# Patient Record
Sex: Male | Born: 1970 | Race: White | Hispanic: No | Marital: Married | Smoking: Never smoker
Health system: Southern US, Community
[De-identification: ages and names within clinical notes are randomized; demographics above are authoritative.]

## PROBLEM LIST (undated history)

## (undated) DIAGNOSIS — J302 Other seasonal allergic rhinitis: Secondary | ICD-10-CM

## (undated) DIAGNOSIS — E7800 Pure hypercholesterolemia, unspecified: Secondary | ICD-10-CM

## (undated) DIAGNOSIS — K219 Gastro-esophageal reflux disease without esophagitis: Secondary | ICD-10-CM

## (undated) DIAGNOSIS — K4090 Unilateral inguinal hernia, without obstruction or gangrene, not specified as recurrent: Secondary | ICD-10-CM

## (undated) DIAGNOSIS — R519 Headache, unspecified: Secondary | ICD-10-CM

## (undated) DIAGNOSIS — J45909 Unspecified asthma, uncomplicated: Secondary | ICD-10-CM

## (undated) HISTORY — PX: WISDOM TOOTH EXTRACTION: SHX21

---

## 1998-10-15 ENCOUNTER — Ambulatory Visit (HOSPITAL_COMMUNITY): Admission: RE | Admit: 1998-10-15 | Discharge: 1998-10-15 | Payer: Self-pay | Admitting: Obstetrics & Gynecology

## 1999-04-08 ENCOUNTER — Encounter: Payer: Self-pay | Admitting: Urology

## 1999-04-08 ENCOUNTER — Ambulatory Visit (HOSPITAL_COMMUNITY): Admission: RE | Admit: 1999-04-08 | Discharge: 1999-04-08 | Payer: Self-pay | Admitting: Urology

## 1999-06-27 ENCOUNTER — Ambulatory Visit (HOSPITAL_BASED_OUTPATIENT_CLINIC_OR_DEPARTMENT_OTHER): Admission: RE | Admit: 1999-06-27 | Discharge: 1999-06-27 | Payer: Self-pay | Admitting: Urology

## 2000-08-17 HISTORY — PX: VARICOSE VEIN SURGERY: SHX832

## 2015-08-18 DIAGNOSIS — I209 Angina pectoris, unspecified: Secondary | ICD-10-CM

## 2015-08-18 HISTORY — DX: Angina pectoris, unspecified: I20.9

## 2016-02-13 ENCOUNTER — Emergency Department (HOSPITAL_COMMUNITY)
Admission: EM | Admit: 2016-02-13 | Discharge: 2016-02-13 | Disposition: A | Discharge status: Home / Self Care | Payer: Commercial Managed Care - HMO | Attending: Emergency Medicine | Admitting: Emergency Medicine

## 2016-02-13 ENCOUNTER — Encounter (HOSPITAL_COMMUNITY): Payer: Self-pay | Admitting: Emergency Medicine

## 2016-02-13 ENCOUNTER — Emergency Department (HOSPITAL_COMMUNITY): Payer: Commercial Managed Care - HMO

## 2016-02-13 DIAGNOSIS — E7800 Pure hypercholesterolemia, unspecified: Secondary | ICD-10-CM | POA: Diagnosis not present

## 2016-02-13 DIAGNOSIS — R079 Chest pain, unspecified: Secondary | ICD-10-CM | POA: Diagnosis present

## 2016-02-13 HISTORY — DX: Pure hypercholesterolemia, unspecified: E78.00

## 2016-02-13 LAB — CBC
HCT: 44.6 % (ref 39.0–52.0)
HEMOGLOBIN: 15.5 g/dL (ref 13.0–17.0)
MCH: 31.8 pg (ref 26.0–34.0)
MCHC: 34.8 g/dL (ref 30.0–36.0)
MCV: 91.6 fL (ref 78.0–100.0)
Platelets: 226 10*3/uL (ref 150–400)
RBC: 4.87 MIL/uL (ref 4.22–5.81)
RDW: 13 % (ref 11.5–15.5)
WBC: 5.8 10*3/uL (ref 4.0–10.5)

## 2016-02-13 LAB — BASIC METABOLIC PANEL
Anion gap: 7 (ref 5–15)
BUN: 13 mg/dL (ref 6–20)
CO2: 26 mmol/L (ref 22–32)
Calcium: 10.1 mg/dL (ref 8.9–10.3)
Chloride: 106 mmol/L (ref 101–111)
Creatinine, Ser: 1.12 mg/dL (ref 0.61–1.24)
GFR calc Af Amer: >60 mL/min (ref >60)
GFR calc non Af Amer: >60 mL/min (ref >60)
Glucose, Bld: 98 mg/dL (ref 65–99)
Potassium: 4.4 mmol/L (ref 3.5–5.1)
Sodium: 139 mmol/L (ref 135–145)

## 2016-02-13 LAB — I-STAT TROPONIN, ED
TROPONIN I, POC: 0 ng/mL (ref 0.00–0.08)
Troponin i, poc: 0 ng/mL (ref 0.00–0.08)

## 2016-02-13 NOTE — ED Provider Notes (Signed)
CSN: [id number]     Arrival date & time 02/13/16  [id number] History   None    Chief Complaint  Patient presents with  . Chest Pain     (Consider location/radiation/quality/duration/timing/severity/associated sxs/prior Treatment) HPI  Pt presenting with c/o chest pain. He states he was eating a nutrition bar this morning for breakfast at 6:30am- and began to have centralized chest pain.  Pain was sharp and lasted approx 3 hours.  He also had some pain and tingling in left arm.  Pain was not associated with exertion.  No nausea or diaphoresis.  He states he has had reflux pain in the past but nothing similar to this before.  No difficulty breathing.  No leg swelling.  No recent travel/trauma/surgery.  No hx of DVT/PE.  Pt has hx of hypercholesterolemia, as well as father died of MI in 10240s.  Pt currently has no pain.  There are no other associated systemic symptoms, there are no other alleviating or modifying factors.   Past Medical History  Diagnosis Date  . Hypercholesteremia    History reviewed. No pertinent past surgical history. History reviewed. No pertinent family history. Social History  Substance Use Topics  . Smoking status: Never Smoker   . Smokeless tobacco: None  . Alcohol Use: Yes    Review of Systems  ROS reviewed and all otherwise negative except for mentioned in HPI    Allergies  Review of patient's allergies indicates no known allergies.  Home Medications   Prior to Admission medications   Not on File   BP 134/97 mmHg  Pulse 74  Temp(Src) 98.4 F (36.9 C) (Oral)  Resp 12  SpO2 99%  Vitals reviewed Physical Exam  Physical Examination: General appearance - alert, well appearing, and in no distress Mental status - alert, oriented to person, place, and time Eyes - no conjunctival injection no scleral icterus Chest - clear to auscultation, no wheezes, rales or rhonchi, symmetric air entry Heart - normal rate, regular rhythm, normal S1, S2, no murmurs, rubs,  clicks or gallops Abdomen - soft, nontender, nondistended, no masses or organomegaly Neurological - alert, oriented, normal speech Extremities - peripheral pulses normal, no pedal edema, no clubbing or cyanosis Skin - normal coloration and turgor, no rashes  ED Course  Procedures (including critical care time) Labs Review Labs Reviewed  BASIC METABOLIC PANEL  CBC  I-STAT TROPOININ, ED  Rosezena SensorI-STAT TROPOININ, ED    Imaging Review Dg Chest 2 View  02/13/2016  CLINICAL DATA:  Midline lower chest pain, left arm numbness starting this morning EXAM: CHEST  2 VIEW COMPARISON:  None. FINDINGS: Cardiomediastinal silhouette is unremarkable. No acute infiltrate or pleural effusion. No pulmonary edema. Bony thorax is unremarkable. IMPRESSION: No active cardiopulmonary disease. Electronically Signed   By: Natasha MeadLiviu  Pop M.D.   On: 02/13/2016 10:09   I have personally reviewed and evaluated these images and lab results as part of my medical decision-making.   EKG Interpretation   Date/Time:  Thursday February 13 2016 09:20:08 EDT Ventricular Rate:  80 PR Interval:  168 QRS Duration: 94 QT Interval:  382 QTC Calculation: 440 R Axis:   23 Text Interpretation:  Sinus rhythm with sinus arrhythmia with occasional  and consecutive Premature ventricular complexes Low voltage QRS T wave  abnormality, consider anterior ischemia Abnormal ECG No old tracing to  compare Confirmed by [facility]  MD, Davarius Ridener [phone] on 02/13/2016 9:40:27 AM      MDM   Final diagnoses:  Chest pain, unspecified chest pain  type    Pt presenting with c/o chest pain- pain lasted approx 3 hours.  Pt has a heart score of 3.  2 sets of troponin were negative making ACS very unlikely- will have him followup with cardiology-  Perc score of zero making him very low risk for PE as well.  No signs of pneumonia/ptx on CXR.  Discharged with strict return precautions.  Pt agreeable with plan.  Pt with a heart score of 3, will get 2 sets of  troponins  Jerelyn ScottMartha Linker, MD 02/13/16 [id number]

## 2016-02-13 NOTE — ED Notes (Signed)
Pt sts mid sternal CP after eating breakfast bar this am; pt sts some tingling and pain in left arm

## 2016-02-13 NOTE — Discharge Instructions (Signed)
Return to the ED with any concerns including difficulty breathing, fainting, worsening or recurrent episodes of chest pain, leg swelling, decreased level of alertness/lethargy, or any other alarming symptoms

## 2016-02-13 NOTE — ED Notes (Signed)
Patient transported to X-ray 

## 2016-02-19 DIAGNOSIS — E7800 Pure hypercholesterolemia, unspecified: Secondary | ICD-10-CM | POA: Insufficient documentation

## 2016-02-21 ENCOUNTER — Encounter: Payer: Self-pay | Admitting: Cardiology

## 2016-02-21 ENCOUNTER — Encounter (INDEPENDENT_AMBULATORY_CARE_PROVIDER_SITE_OTHER): Payer: Self-pay

## 2016-02-21 ENCOUNTER — Ambulatory Visit (INDEPENDENT_AMBULATORY_CARE_PROVIDER_SITE_OTHER): Payer: Commercial Managed Care - HMO | Admitting: Cardiology

## 2016-02-21 VITALS — BP 126/84 | HR 62 | Ht 72.0 in | Wt 187.2 lb

## 2016-02-21 DIAGNOSIS — R079 Chest pain, unspecified: Secondary | ICD-10-CM | POA: Diagnosis not present

## 2016-02-21 NOTE — Progress Notes (Signed)
Cardiology Office Note   Date:  02/21/2016   ID:  Russell BellowsBarry W Castillo, DOB 11/30/1970, MRN [id number]  PCP:  Ethel RanaHEPLER,MARK, PA-C  Cardiologist:  Reshaun Briseno Jorja LoaMartin Lougenia Morrissey, MD    Chief Complaint  Patient presents with  . New Patient (Initial Visit)  . Chest Pain     History of Present Illness: Russell Castillo is a 45 y.o. male who presents today for cardiology evaluation.   History of hyperlipidemia.  Presented to the ER with chest pain.  Was located in the center of the chest, sharp for 3 hours.  Occurred after eating a nutrition bar for breakfast on 6/29.  No nausea of diaphoresis.  Some pain and tingling in left arm. He initially thought that this was due to a circulation issue, but it persisted despite moving his arm around.  Different from his previous reflux pain.  In the ER, troponin negative x2. Since then, he has had similar episodes of chest pain, but they have not been as long [place] have been been as severe. He says that the day he presented to the emergency room, his chest pain was intermittent but did last for 3 hours on and off.   Today, he denies symptoms of palpitations, shortness of breath, orthopnea, PND, lower extremity edema, claudication, dizziness, presyncope, syncope, bleeding, or neurologic sequela. The patient is tolerating medications without difficulties and is otherwise without complaint today.    Past Medical History  Diagnosis Date  . Hypercholesteremia    Past Surgical History  Procedure Laterality Date  . Varicose vein surgery  2002     Current Outpatient Prescriptions  Medication Sig Dispense Refill  . fenofibrate 160 MG tablet Take 160 mg by mouth daily.  3  . loratadine (CLARITIN) 10 MG tablet Take 10 mg by mouth daily as needed for allergies.    Marland Kitchen. omeprazole (PRILOSEC) 20 MG capsule Take 20 mg by mouth daily.     No current facility-administered medications for this visit.    Allergies:   Review of patient's allergies indicates no known allergies.    Social History:  The patient  reports that he has never smoked. He does not have any smokeless tobacco history on file. He reports that he drinks alcohol. He reports that he does not use illicit drugs.   Family History:  The patient's family history includes Cancer in his mother; Celiac disease in his brother; Heart attack in his father and paternal grandfather; Testicular cancer (age of onset: 430) in his brother.    ROS:  Please see the history of present illness.   Otherwise, review of systems is positive for chest pain/pressure.   All other systems are reviewed and negative.    PHYSICAL EXAM: VS:  BP 126/84 mmHg  Pulse 62  Ht 6' (1.829 m)  Wt 187 lb 3.2 oz (84.913 kg)  BMI 25.38 kg/m2 , BMI Body mass index is 25.38 kg/(m^2). GEN: Well nourished, well developed, in no acute distress HEENT: normal Neck: no JVD, carotid bruits, or masses Cardiac: RRR; no murmurs, rubs, or gallops,no edema  Respiratory:  clear to auscultation bilaterally, normal work of breathing GI: soft, nontender, nondistended, + BS MS: no deformity or atrophy Skin: warm and dry Neuro:  Strength and sensation are intact Psych: euthymic mood, full affect  EKG:  EKG is ordered today. The ekg ordered today shows sinus arrhythmia, rate 61  Recent Labs: 02/13/2016: BUN 13; Creatinine, Ser 1.12; Hemoglobin 15.5; Platelets 226; Potassium 4.4; Sodium 139    Lipid  Panel  No results found for: CHOL, TRIG, HDL, CHOLHDL, VLDL, LDLCALC, LDLDIRECT   Wt Readings from Last 3 Encounters:  02/21/16 187 lb 3.2 oz (84.913 kg)      Other studies Reviewed: Additional studies/ records that were reviewed today include: ER notes   ASSESSMENT AND PLAN:  1.  Chest pain: Had episodes of intermittent chest pressure last week. Workup in the emergency room was negative including negative enzymes and an EKG without major abnormality. Due to the fact that he had substernal chest pain and pressure that radiated to his left arm and  was intermittent, we'll order a stress Myoview to determine if he has cardiac causes. He has a little bit higher risk, as his father died at age 45.    Current medicines are reviewed at length with the patient today.   The patient does not have concerns regarding his medicines.  The following changes were made today:  none  Labs/ tests ordered today include:  Orders Placed This Encounter  Procedures  . Myocardial Perfusion Imaging  . EKG 12-Lead     Disposition:   FU with [name] PRN  Signed, Brekyn Huntoon Jorja LoaMartin Grantley Savage, MD  02/21/2016 11:53 AM     [facility] [address] [place] [address] [phone] (office) [phone] (fax)

## 2016-02-21 NOTE — Patient Instructions (Signed)
Medication Instructions:  Your physician recommends that you continue on your current medications as directed. Please refer to the Current Medication list given to you today.  Labwork: None ordered  Testing/Procedures: Your physician has requested that you have a lexiscan myoview. For further information please visit [email or web address]. Please follow instruction sheet, as given.  Follow-Up: To be determined once physician has reviewed test.  We will call you with results.  If you need a refill on your cardiac medications before your next appointment, please call your pharmacy.  Thank you for choosing [facility]!!   Dory HornSherri Alexiss Iturralde, RN [phone]  Any Other Special Instructions Will Be Listed Below (If Applicable).  Pharmacologic Stress Electrocardiogram A pharmacologic stress electrocardiogram is a heart (cardiac) test that uses nuclear imaging to evaluate the blood supply to your heart. This test may also be called a pharmacologic stress electrocardiography. Pharmacologic means that a medicine is used to increase your heart rate and blood pressure.  This stress test is done to find areas of poor blood flow to the heart by determining the extent of coronary artery disease (CAD). Some people exercise on a treadmill, which naturally increases the blood flow to the heart. For those people unable to exercise on a treadmill, a medicine is used. This medicine stimulates your heart and will cause your heart to beat harder and more quickly, as if you were exercising.  Pharmacologic stress tests can help determine:  The adequacy of blood flow to your heart during increased levels of activity in order to clear you for discharge home.  The extent of coronary artery blockage caused by CAD.  Your prognosis if you have suffered a heart attack.  The effectiveness of cardiac procedures done, such as an angioplasty, which can increase the circulation in your coronary arteries.  Causes of  chest pain or pressure. LET [facility] KNOW ABOUT:  Any allergies you have.  All medicines you are taking, including vitamins, herbs, eye drops, creams, and over-the-counter medicines.  Previous problems you or members of your family have had with the use of anesthetics.  Any blood disorders you have.  Previous surgeries you have had.  Medical conditions you have.  Possibility of pregnancy, if this applies.  If you are currently breastfeeding. RISKS AND COMPLICATIONS Generally, this is a safe procedure. However, as with any procedure, complications can occur. Possible complications include:  You develop pain or pressure in the following areas:  Chest.  Jaw or neck.  Between your shoulder blades.  Radiating down your left arm.  Headache.  Dizziness or light-headedness.  Shortness of breath.  Increased or irregular heartbeat.  Low blood pressure.  Nausea or vomiting.  Flushing.  Redness going up the arm and slight pain during injection of medicine.  Heart attack (rare). BEFORE THE PROCEDURE   Avoid all forms of caffeine for 24 hours before your test or as directed by your health care provider. This includes coffee, tea (even decaffeinated tea), caffeinated sodas, chocolate, cocoa, and certain pain medicines.  Follow your health care provider's instructions regarding eating and drinking before the test.  Take your medicines as directed at regular times with water unless instructed otherwise. Exceptions may include:  If you have diabetes, ask how you are to take your insulin or pills. It is common to adjust insulin dosing the morning of the test.  If you are taking beta-blocker medicines, it is important to talk to your health care provider about these medicines well before the date of your  test. Taking beta-blocker medicines may interfere with the test. In some cases, these medicines need to be changed or stopped 24 hours or more before the  test.  If you wear a nitroglycerin patch, it may need to be removed prior to the test. Ask your health care provider if the patch should be removed before the test.  If you use an inhaler for any breathing condition, bring it with you to the test.  If you are an outpatient, bring a snack so you can eat right after the stress phase of the test.  Do not smoke for 4 hours prior to the test or as directed by your health care provider.  Do not apply lotions, powders, creams, or oils on your chest prior to the test.  Wear comfortable shoes and clothing. Let your health care provider know if you were unable to complete or follow the preparations for your test. PROCEDURE   Multiple patches (electrodes) will be put on your chest. If needed, small areas of your chest may be shaved to get better contact with the electrodes. Once the electrodes are attached to your body, multiple wires will be attached to the electrodes, and your heart rate will be monitored.  An IV access will be started. A nuclear trace (isotope) is given. The isotope may be given intravenously, or it may be swallowed. Nuclear refers to several types of radioactive isotopes, and the nuclear isotope lights up the arteries so that the nuclear images are clear. The isotope is absorbed by your body. This results in low radiation exposure.  A resting nuclear image is taken to show how your heart functions at rest.  A medicine is given through the IV access.  A second scan is done about 1 hour after the medicine injection and determines how your heart functions under stress.  During this stress phase, you will be connected to an electrocardiogram machine. Your blood pressure and oxygen levels will be monitored. AFTER THE PROCEDURE   Your heart rate and blood pressure will be monitored after the test.  You may return to your normal schedule, including diet,activities, and medicines, unless your health care provider tells you  otherwise.   This information is not intended to replace advice given to you by your health care provider. Make sure you discuss any questions you have with your health care provider.   Document Released: 12/20/2008 Document Revised: 08/08/2013 Document Reviewed: 04/10/2013 Elsevier Interactive Patient Education [employer]2016 Elsevier Inc.

## 2016-02-26 ENCOUNTER — Ambulatory Visit (HOSPITAL_COMMUNITY): Payer: Commercial Managed Care - HMO | Attending: Cardiology

## 2016-02-26 DIAGNOSIS — Z8249 Family history of ischemic heart disease and other diseases of the circulatory system: Secondary | ICD-10-CM | POA: Insufficient documentation

## 2016-02-26 DIAGNOSIS — R079 Chest pain, unspecified: Secondary | ICD-10-CM | POA: Diagnosis not present

## 2016-02-26 LAB — MYOCARDIAL PERFUSION IMAGING
CHL CUP MPHR: 175 {beats}/min
CHL CUP NUCLEAR SDS: 0
CHL CUP NUCLEAR SRS: 2
CHL CUP NUCLEAR SSS: 2
Estimated workload: 12.7 METS
Exercise duration (min): 10 min
Exercise duration (sec): 45 s
LHR: 0.32
LV dias vol: 108 mL (ref 62–150)
LV sys vol: 51 mL
NUC STRESS TID: 1.02
Peak HR: 164 {beats}/min
Percent HR: 93 %
Rest HR: 65 {beats}/min

## 2016-02-26 MED ORDER — TECHNETIUM TC 99M TETROFOSMIN IV KIT
32.4000 | PACK | Freq: Once | INTRAVENOUS | Status: AC | PRN
  Administered 2016-02-26: 32 via INTRAVENOUS
  Filled 2016-02-26: qty 32

## 2016-02-26 MED ORDER — TECHNETIUM TC 99M TETROFOSMIN IV KIT
10.3000 | PACK | Freq: Once | INTRAVENOUS | Status: AC | PRN
  Administered 2016-02-26: 10 via INTRAVENOUS
  Filled 2016-02-26: qty 10

## 2017-02-27 IMAGING — NM NM MISC PROCEDURE
6 series · 36 of 36 positions shown · non-contrast
Comparison: none

[Series 1: rest · 6.51mm/px · 6 of 64 frames shown]
[frame 6/64]
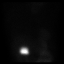
[frame 16/64]
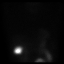
[frame 27/64]
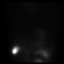
[frame 38/64]
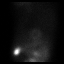
[frame 48/64]
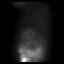
[frame 59/64]
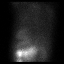

[Series 1: wbr_r-proj_st rest · 6.51mm/px · 6 of 64 frames shown]
[frame 6/64]
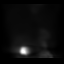
[frame 16/64]
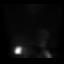
[frame 27/64]
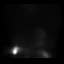
[frame 38/64]
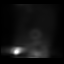
[frame 48/64]
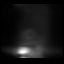
[frame 59/64]
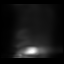

[Series 2: wbr_s-proj_st stress · 6.51mm/px · 6 of 512 frames shown (1 of 2)]
[frame 43/512]
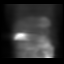
[frame 128/512]
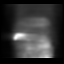
[frame 214/512]
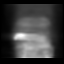
[frame 299/512]
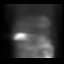
[frame 384/512]
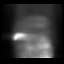
[frame 470/512]
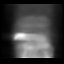

[Series 2: stress · 6.51mm/px · 6 of 64 frames shown (1 of 2)]
[frame 6/64]
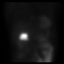
[frame 16/64]
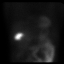
[frame 27/64]
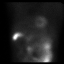
[frame 38/64]
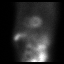
[frame 48/64]
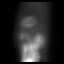
[frame 59/64]
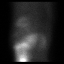

[Series 2: stress · 6.51mm/px · 6 of 512 frames shown (2 of 2)]
[frame 43/512]
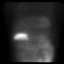
[frame 128/512]
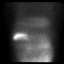
[frame 214/512]
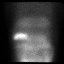
[frame 299/512]
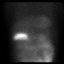
[frame 384/512]
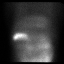
[frame 470/512]
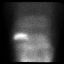

[Series 2: wbr_s-proj_st stress · 6.51mm/px · 6 of 64 frames shown (2 of 2)]
[frame 6/64]
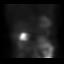
[frame 16/64]
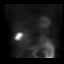
[frame 27/64]
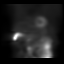
[frame 38/64]
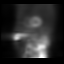
[frame 48/64]
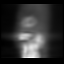
[frame 59/64]
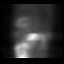

[36 of 36 positions shown; findings below may reference images not displayed]

Canned report from images found in remote index.

Refer to host system for actual result text.

## 2024-01-25 ENCOUNTER — Encounter (HOSPITAL_COMMUNITY): Payer: Self-pay

## 2024-01-25 ENCOUNTER — Ambulatory Visit: Payer: Self-pay | Admitting: General Surgery

## 2024-01-25 NOTE — Patient Instructions (Signed)
 SURGICAL WAITING ROOM VISITATION  Patients having surgery or a procedure may have no more than 2 support people in the waiting area - these visitors may rotate.    Children under the age of 66 must have an adult with them who is not the patient.  Visitors with respiratory illnesses are discouraged from visiting and should remain at home.  If the patient needs to stay at the hospital during part of their recovery, the visitor guidelines for inpatient rooms apply. Pre-op nurse will coordinate an appropriate time for 1 support person to accompany patient in pre-op.  This support person may not rotate.    Please refer to the [facility] website for the visitor guidelines for Inpatients (after your surgery is over and you are in a regular room).       Your procedure is scheduled on: Friday, February 11, 2024   Report to [facility] Main Entrance    Report to admitting at 7:45 AM   Call this number if you have problems the morning of surgery [phone]   Do not eat food :After Midnight.   After Midnight you may have the following liquids until ______ AM/ PM DAY OF SURGERY  Water Non-Citrus Juices (without pulp, NO RED-Apple, White grape, White cranberry) Black Coffee (NO MILK/CREAM OR CREAMERS, sugar ok)  Clear Tea (NO MILK/CREAM OR CREAMERS, sugar ok) regular and decaf                             Plain Jell-O (NO RED)                                           Fruit ices (not with fruit pulp, NO RED)                                     Popsicles (NO RED)                                                               Sports drinks like Gatorade (NO RED)              Drink 2 Ensure/G2 drinks AT 10:00 PM the night before surgery.        The day of surgery:  Drink ONE (1) Pre-Surgery Clear Ensure or G2 at AM the morning of surgery. Drink in one sitting. Do not sip.  This drink was given to you during your hospital  pre-op appointment visit. Nothing else to drink after  completing the  Pre-Surgery Clear Ensure or G2.          If you have questions, please contact your surgeon's office.   FOLLOW BOWEL PREP AND ANY ADDITIONAL PRE OP INSTRUCTIONS YOU RECEIVED FROM YOUR SURGEON'S OFFICE!!!     Oral Hygiene is also important to reduce your risk of infection.                                    Remember - BRUSH  YOUR TEETH THE MORNING OF SURGERY WITH YOUR REGULAR TOOTHPASTE  DENTURES WILL BE REMOVED PRIOR TO SURGERY PLEASE DO NOT APPLY "Poly grip" OR ADHESIVES!!!   Do NOT smoke after Midnight   Stop all vitamins and herbal supplements 7 days before surgery.   Take these medicines the morning of surgery with A SIP OF WATER: Fexofenadine            Rosuvastatin  DO NOT TAKE ANY ORAL DIABETIC MEDICATIONS DAY OF YOUR SURGERY  Bring CPAP mask and tubing day of surgery.                              You may not have any metal on your body including hair pins, jewelry, and body piercing             Do not wear lotions, powders, perfumes/cologne, or deodorant              Men may shave face and neck.   Do not bring valuables to the hospital. [facility] IS NOT             RESPONSIBLE   FOR VALUABLES.   Contacts, glasses, dentures or bridgework may not be worn into surgery.   Bring small overnight bag day of surgery.   DO NOT BRING YOUR HOME MEDICATIONS TO THE HOSPITAL. PHARMACY WILL DISPENSE MEDICATIONS LISTED ON YOUR MEDICATION LIST TO YOU DURING YOUR ADMISSION IN THE HOSPITAL!    Patients discharged on the day of surgery will not be allowed to drive home.  Someone NEEDS to stay with you for the first 24 hours after anesthesia.   Special Instructions: Bring a copy of your healthcare power of attorney and living will documents the day of surgery if you haven't scanned them before.              Please read over the following fact sheets you were given: IF YOU HAVE QUESTIONS ABOUT YOUR PRE-OP INSTRUCTIONS PLEASE CALL [phone]   If you received a  COVID test during your pre-op visit  it is requested that you wear a mask when out in public, stay away from anyone that may not be feeling well and notify your surgeon if you develop symptoms. If you test positive for Covid or have been in contact with anyone that has tested positive in the last 10 days please notify you surgeon.     - Preparing for Surgery Before surgery, you can play an important role.  Because skin is not sterile, your skin needs to be as free of germs as possible.  You can reduce the number of germs on your skin by washing with CHG (chlorahexidine gluconate) soap before surgery.  CHG is an antiseptic cleaner which kills germs and bonds with the skin to continue killing germs even after washing. Please DO NOT use if you have an allergy to CHG or antibacterial soaps.  If your skin becomes reddened/irritated stop using the CHG and inform your nurse when you arrive at Short Stay. Do not shave (including legs and underarms) for at least 48 hours prior to the first CHG shower.  You may shave your face/neck.  Please follow these instructions carefully:  1.  Shower with CHG Soap the night before surgery and the  morning of surgery.  2.  If you choose to wash your hair, wash your hair first as usual with your normal  shampoo.  3.  After  you shampoo, rinse your hair and body thoroughly to remove the shampoo.                             4.  Use CHG as you would any other liquid soap.  You can apply chg directly to the skin and wash.  Gently with a scrungie or clean washcloth.  5.  Apply the CHG Soap to your body ONLY FROM THE NECK DOWN.   Do   not use on face/ open                           Wound or open sores. Avoid contact with eyes, ears mouth and   genitals (private parts).                       Wash face,  Genitals (private parts) with your normal soap.             6.  Wash thoroughly, paying special attention to the area where your    surgery  will be performed.  7.   Thoroughly rinse your body with warm water from the neck down.  8.  DO NOT shower/wash with your normal soap after using and rinsing off the CHG Soap.                9.  Pat yourself dry with a clean towel.            10.  Wear clean pajamas.            11.  Place clean sheets on your bed the night of your first shower and do not  sleep with pets. Day of Surgery : Do not apply any lotions/deodorants the morning of surgery.  Please wear clean clothes to the hospital/surgery center.  FAILURE TO FOLLOW THESE INSTRUCTIONS MAY RESULT IN THE CANCELLATION OF YOUR SURGERY  PATIENT SIGNATURE_________________________________  NURSE SIGNATURE__________________________________  ________________________________________________________________________

## 2024-01-25 NOTE — Progress Notes (Signed)
 Sent message, via epic in basket, requesting orders in epic from Careers adviser.

## 2024-01-26 ENCOUNTER — Other Ambulatory Visit: Payer: Self-pay

## 2024-01-26 ENCOUNTER — Encounter (HOSPITAL_COMMUNITY): Payer: Self-pay

## 2024-01-26 ENCOUNTER — Encounter (HOSPITAL_COMMUNITY)
Admission: RE | Admit: 2024-01-26 | Discharge: 2024-01-26 | Disposition: A | Discharge status: Home / Self Care | Source: Ambulatory Visit | Attending: General Surgery | Admitting: General Surgery

## 2024-01-26 DIAGNOSIS — Z01812 Encounter for preprocedural laboratory examination: Secondary | ICD-10-CM | POA: Insufficient documentation

## 2024-01-26 HISTORY — DX: Gastro-esophageal reflux disease without esophagitis: K21.9

## 2024-01-26 HISTORY — DX: Headache, unspecified: R51.9

## 2024-01-26 HISTORY — DX: Unilateral inguinal hernia, without obstruction or gangrene, not specified as recurrent: K40.90

## 2024-01-26 HISTORY — DX: Unspecified asthma, uncomplicated: J45.909

## 2024-01-26 HISTORY — DX: Other seasonal allergic rhinitis: J30.2

## 2024-01-26 NOTE — Progress Notes (Addendum)
 PCP - Albertina Alpers  LOV 01-06-24 CE Cardiologist - Camnitz, Will Martin,MD  LOV 2017 f/u prn  PPM/ICD -  Device Orders -  Rep Notified -   Chest x-ray - In  Note in CE dated 01-06-24 EKG -  Stress Test - 2017 epic ECHO -  Cardiac Cath -   Sleep Study - no CPAP - no  Fasting Blood Sugar - N/A Checks Blood Sugar __no___ times a day  Blood Thinner Instructions: Aspirin Instructions:  ERAS Protcol -Y PRE-SURGERY no drink    COVID vaccine -yes  Activity--Able to complete A flight of stairs with no CP or SOB Anesthesia review: Chronic cough  Patient denies shortness of breath, fever, cough and chest pain at PAT appointment   All instructions explained to the patient, with a verbal understanding of the material. Patient agrees to go over the instructions while at home for a better understanding. Patient also instructed to self quarantine after being tested for COVID-19. The opportunity to ask questions was provided.

## 2024-02-10 NOTE — Anesthesia Preprocedure Evaluation (Addendum)
 Anesthesia Evaluation  Patient identified by MRN, date of birth, ID band Patient awake    Reviewed: Allergy & Precautions, NPO status , Patient's Chart, lab work & pertinent test results  History of Anesthesia Complications Negative for: history of anesthetic complications  Airway Mallampati: III  TM Distance: >3 FB Neck ROM: Full    Dental  (+)    Pulmonary neg shortness of breath, asthma , neg sleep apnea, neg COPD, neg recent URI   Pulmonary exam normal breath sounds clear to auscultation       Cardiovascular (-) hypertension(-) angina (-) Past MI, (-) Cardiac Stents and (-) CABG (-) dysrhythmias  Rhythm:Regular Rate:Normal  HLD  Low-risk stress test 02/26/2016   Neuro/Psych  Headaches, neg Seizures    GI/Hepatic Neg liver ROS,GERD  Medicated,,  Endo/Other  negative endocrine ROS    Renal/GU negative Renal ROS     Musculoskeletal   Abdominal   Peds  Hematology negative hematology ROS (+)      Anesthesia Other Findings   Reproductive/Obstetrics                             Anesthesia Physical Anesthesia Plan  ASA: 2  Anesthesia Plan: General   Post-op Pain Management: Regional block* and Tylenol PO (pre-op)*   Induction: Intravenous  PONV Risk Score and Plan: 2 and Ondansetron, Dexamethasone and Treatment may vary due to age or medical condition  Airway Management Planned: Oral ETT  Additional Equipment:   Intra-op Plan:   Post-operative Plan: Extubation in OR  Informed Consent: I have reviewed the patients History and Physical, chart, labs and discussed the procedure including the risks, benefits and alternatives for the proposed anesthesia with the patient or authorized representative who has indicated his/her understanding and acceptance.     Dental advisory given  Plan Discussed with: CRNA and Anesthesiologist  Anesthesia Plan Comments: (Discussed potential risks  of nerve blocks including, but not limited to, infection, bleeding, nerve damage, seizures, pneumothorax, respiratory depression, and potential failure of the block. Alternatives to nerve blocks discussed. All questions answered.  Risks of general anesthesia discussed including, but not limited to, sore throat, hoarse voice, chipped/damaged teeth, injury to vocal cords, nausea and vomiting, allergic reactions, lung infection, heart attack, stroke, and death. All questions answered. )        Anesthesia Quick Evaluation

## 2024-02-11 ENCOUNTER — Ambulatory Visit (HOSPITAL_COMMUNITY)
Admission: RE | Admit: 2024-02-11 | Discharge: 2024-02-11 | Disposition: A | Discharge status: Home / Self Care | Source: Ambulatory Visit | Attending: General Surgery | Admitting: General Surgery

## 2024-02-11 ENCOUNTER — Ambulatory Visit (HOSPITAL_COMMUNITY): Payer: Self-pay | Admitting: Anesthesiology

## 2024-02-11 ENCOUNTER — Ambulatory Visit (HOSPITAL_BASED_OUTPATIENT_CLINIC_OR_DEPARTMENT_OTHER): Payer: Self-pay | Admitting: Anesthesiology

## 2024-02-11 ENCOUNTER — Encounter (HOSPITAL_COMMUNITY)
Admission: RE | Disposition: A | Discharge status: Home / Self Care | Payer: Self-pay | Source: Ambulatory Visit | Attending: General Surgery

## 2024-02-11 ENCOUNTER — Other Ambulatory Visit: Payer: Self-pay

## 2024-02-11 ENCOUNTER — Encounter (HOSPITAL_COMMUNITY): Payer: Self-pay | Admitting: General Surgery

## 2024-02-11 DIAGNOSIS — E785 Hyperlipidemia, unspecified: Secondary | ICD-10-CM | POA: Diagnosis not present

## 2024-02-11 DIAGNOSIS — K4090 Unilateral inguinal hernia, without obstruction or gangrene, not specified as recurrent: Secondary | ICD-10-CM | POA: Diagnosis present

## 2024-02-11 DIAGNOSIS — J45909 Unspecified asthma, uncomplicated: Secondary | ICD-10-CM | POA: Insufficient documentation

## 2024-02-11 DIAGNOSIS — Z79899 Other long term (current) drug therapy: Secondary | ICD-10-CM | POA: Insufficient documentation

## 2024-02-11 DIAGNOSIS — K219 Gastro-esophageal reflux disease without esophagitis: Secondary | ICD-10-CM | POA: Insufficient documentation

## 2024-02-11 DIAGNOSIS — D176 Benign lipomatous neoplasm of spermatic cord: Secondary | ICD-10-CM | POA: Insufficient documentation

## 2024-02-11 HISTORY — PX: XI ROBOTIC ASSISTED INGUINAL HERNIA REPAIR WITH MESH: SHX6706

## 2024-02-11 SURGERY — REPAIR, HERNIA, INGUINAL, ROBOT-ASSISTED, LAPAROSCOPIC, USING MESH
Anesthesia: General | Laterality: Right

## 2024-02-11 MED ORDER — LIDOCAINE HCL (PF) 2 % IJ SOLN
INTRAMUSCULAR | Status: DC | PRN
Start: 2024-02-11 — End: 2024-02-11
  Administered 2024-02-11: 50 mg via INTRADERMAL

## 2024-02-11 MED ORDER — HYDRALAZINE HCL 20 MG/ML IJ SOLN
INTRAMUSCULAR | Status: AC
Start: 2024-02-11 — End: 2024-02-11
  Filled 2024-02-11: qty 1

## 2024-02-11 MED ORDER — MEPERIDINE HCL 50 MG/ML IJ SOLN
6.2500 mg | INTRAMUSCULAR | Status: DC | PRN
  Administered 2024-02-11: 12.5 mg via INTRAVENOUS

## 2024-02-11 MED ORDER — OXYCODONE HCL 5 MG/5ML PO SOLN: 5.0000 mg | Freq: Once | ORAL | Status: AC | PRN

## 2024-02-11 MED ORDER — OXYCODONE HCL 5 MG PO TABS
ORAL_TABLET | ORAL | Status: AC
Start: 2024-02-11 — End: 2024-02-11
  Filled 2024-02-11: qty 1

## 2024-02-11 MED ORDER — FENTANYL CITRATE PF 50 MCG/ML IJ SOSY
25.0000 ug | PREFILLED_SYRINGE | INTRAMUSCULAR | Status: DC | PRN
  Administered 2024-02-11: 50 ug via INTRAVENOUS

## 2024-02-11 MED ORDER — CHLORHEXIDINE GLUCONATE 0.12 % MT SOLN
15.0000 mL | Freq: Once | OROMUCOSAL | Status: AC
  Administered 2024-02-11: 15 mL via OROMUCOSAL

## 2024-02-11 MED ORDER — DEXMEDETOMIDINE HCL IN NACL 80 MCG/20ML IV SOLN
INTRAVENOUS | Status: DC | PRN
  Administered 2024-02-11 (×3): 4 ug via INTRAVENOUS

## 2024-02-11 MED ORDER — PHENYLEPHRINE HCL (PRESSORS) 10 MG/ML IV SOLN
INTRAVENOUS | Status: AC
  Filled 2024-02-11: qty 1

## 2024-02-11 MED ORDER — IBUPROFEN 200 MG PO TABS: 600.0000 mg | ORAL_TABLET | Freq: Four times a day (QID) | ORAL | 0 refills | Status: AC

## 2024-02-11 MED ORDER — MORPHINE SULFATE (PF) 2 MG/ML IV SOLN: 1.0000 mg | INTRAVENOUS | Status: DC | PRN

## 2024-02-11 MED ORDER — FENTANYL CITRATE (PF) 100 MCG/2ML IJ SOLN
INTRAMUSCULAR | Status: DC | PRN
  Administered 2024-02-11 (×2): 50 ug via INTRAVENOUS
  Administered 2024-02-11: 100 ug via INTRAVENOUS

## 2024-02-11 MED ORDER — LACTATED RINGERS IV SOLN: INTRAVENOUS | Status: DC | PRN

## 2024-02-11 MED ORDER — KETAMINE HCL 50 MG/5ML IJ SOSY
PREFILLED_SYRINGE | INTRAMUSCULAR | Status: AC
  Filled 2024-02-11: qty 5

## 2024-02-11 MED ORDER — DEXAMETHASONE SODIUM PHOSPHATE 10 MG/ML IJ SOLN
INTRAMUSCULAR | Status: DC | PRN
  Administered 2024-02-11: 8 mg via INTRAVENOUS

## 2024-02-11 MED ORDER — 0.9 % SODIUM CHLORIDE (POUR BTL) OPTIME
TOPICAL | Status: DC | PRN
  Administered 2024-02-11: 1000 mL

## 2024-02-11 MED ORDER — KETAMINE HCL 50 MG/5ML IJ SOSY
PREFILLED_SYRINGE | INTRAMUSCULAR | Status: DC | PRN
Start: 2024-02-11 — End: 2024-02-11
  Administered 2024-02-11: 30 mg via INTRAVENOUS
  Administered 2024-02-11: 20 mg via INTRAVENOUS

## 2024-02-11 MED ORDER — FENTANYL CITRATE PF 50 MCG/ML IJ SOSY
PREFILLED_SYRINGE | INTRAMUSCULAR | Status: AC
  Filled 2024-02-11: qty 1

## 2024-02-11 MED ORDER — BUPIVACAINE HCL (PF) 0.25 % IJ SOLN
INTRAMUSCULAR | Status: DC | PRN
Start: 2024-02-11 — End: 2024-02-11
  Administered 2024-02-11 (×2): 25 mL via PERINEURAL

## 2024-02-11 MED ORDER — OXYCODONE HCL 5 MG PO TABS: 5.0000 mg | ORAL_TABLET | Freq: Three times a day (TID) | ORAL | 0 refills | Status: AC | PRN

## 2024-02-11 MED ORDER — FENTANYL CITRATE (PF) 100 MCG/2ML IJ SOLN
INTRAMUSCULAR | Status: AC
  Filled 2024-02-11: qty 2

## 2024-02-11 MED ORDER — ACETAMINOPHEN 650 MG RE SUPP
650.0000 mg | RECTAL | Status: DC | PRN
Start: 2024-02-11 — End: 2024-02-11

## 2024-02-11 MED ORDER — SODIUM CHLORIDE 0.9% FLUSH: 3.0000 mL | INTRAVENOUS | Status: DC | PRN

## 2024-02-11 MED ORDER — LIDOCAINE HCL (PF) 2 % IJ SOLN
INTRAMUSCULAR | Status: AC
  Filled 2024-02-11: qty 5

## 2024-02-11 MED ORDER — ACETAMINOPHEN 500 MG PO TABS
1000.0000 mg | ORAL_TABLET | ORAL | Status: AC
  Administered 2024-02-11: 1000 mg via ORAL
  Filled 2024-02-11: qty 2

## 2024-02-11 MED ORDER — ACETAMINOPHEN 325 MG PO TABS: 650.0000 mg | ORAL_TABLET | ORAL | Status: DC | PRN

## 2024-02-11 MED ORDER — CELECOXIB 200 MG PO CAPS
200.0000 mg | ORAL_CAPSULE | ORAL | Status: AC
  Administered 2024-02-11: 200 mg via ORAL
  Filled 2024-02-11: qty 1

## 2024-02-11 MED ORDER — ONDANSETRON HCL 4 MG/2ML IJ SOLN
INTRAMUSCULAR | Status: AC
Start: 2024-02-11 — End: 2024-02-11
  Filled 2024-02-11: qty 2

## 2024-02-11 MED ORDER — MIDAZOLAM HCL 2 MG/2ML IJ SOLN
1.0000 mg | INTRAMUSCULAR | Status: DC
  Administered 2024-02-11: 2 mg via INTRAVENOUS
  Filled 2024-02-11: qty 2

## 2024-02-11 MED ORDER — SODIUM CHLORIDE 0.9% FLUSH: 3.0000 mL | Freq: Two times a day (BID) | INTRAVENOUS | Status: DC

## 2024-02-11 MED ORDER — BUPIVACAINE-EPINEPHRINE 0.25% -1:200000 IJ SOLN
INTRAMUSCULAR | Status: DC | PRN
  Administered 2024-02-11: 10 mL

## 2024-02-11 MED ORDER — ROCURONIUM BROMIDE 10 MG/ML (PF) SYRINGE
PREFILLED_SYRINGE | INTRAVENOUS | Status: AC
  Filled 2024-02-11: qty 10

## 2024-02-11 MED ORDER — HYDRALAZINE HCL 20 MG/ML IJ SOLN
5.0000 mg | INTRAMUSCULAR | Status: DC | PRN
  Administered 2024-02-11 (×2): 5 mg via INTRAVENOUS

## 2024-02-11 MED ORDER — SODIUM CHLORIDE 0.9 % IV SOLN: 250.0000 mL | INTRAVENOUS | Status: DC | PRN

## 2024-02-11 MED ORDER — BUPIVACAINE-EPINEPHRINE (PF) 0.25% -1:200000 IJ SOLN
INTRAMUSCULAR | Status: AC
  Filled 2024-02-11: qty 30

## 2024-02-11 MED ORDER — SUGAMMADEX SODIUM 200 MG/2ML IV SOLN
INTRAVENOUS | Status: DC | PRN
  Administered 2024-02-11: 320 mg via INTRAVENOUS

## 2024-02-11 MED ORDER — MEPERIDINE HCL 50 MG/ML IJ SOLN
INTRAMUSCULAR | Status: AC
Start: 2024-02-11 — End: 2024-02-11
  Filled 2024-02-11: qty 1

## 2024-02-11 MED ORDER — CHLORHEXIDINE GLUCONATE CLOTH 2 % EX PADS
6.0000 | MEDICATED_PAD | Freq: Once | CUTANEOUS | Status: AC
  Administered 2024-02-11: 6 via TOPICAL

## 2024-02-11 MED ORDER — MIDAZOLAM HCL 2 MG/2ML IJ SOLN
INTRAMUSCULAR | Status: AC
Start: 2024-02-11 — End: 2024-02-11
  Filled 2024-02-11: qty 2

## 2024-02-11 MED ORDER — DEXAMETHASONE SODIUM PHOSPHATE 10 MG/ML IJ SOLN
INTRAMUSCULAR | Status: AC
  Filled 2024-02-11: qty 1

## 2024-02-11 MED ORDER — CHLORHEXIDINE GLUCONATE CLOTH 2 % EX PADS: 6.0000 | MEDICATED_PAD | Freq: Once | CUTANEOUS | Status: DC

## 2024-02-11 MED ORDER — OXYCODONE HCL 5 MG PO TABS: 5.0000 mg | ORAL_TABLET | ORAL | Status: DC | PRN

## 2024-02-11 MED ORDER — GABAPENTIN 300 MG PO CAPS
300.0000 mg | ORAL_CAPSULE | ORAL | Status: AC
  Administered 2024-02-11: 300 mg via ORAL
  Filled 2024-02-11: qty 1

## 2024-02-11 MED ORDER — PROPOFOL 10 MG/ML IV BOLUS
INTRAVENOUS | Status: AC
  Filled 2024-02-11: qty 20

## 2024-02-11 MED ORDER — ORAL CARE MOUTH RINSE: 15.0000 mL | Freq: Once | OROMUCOSAL | Status: AC

## 2024-02-11 MED ORDER — PHENYLEPHRINE 80 MCG/ML (10ML) SYRINGE FOR IV PUSH (FOR BLOOD PRESSURE SUPPORT)
PREFILLED_SYRINGE | INTRAVENOUS | Status: AC
  Filled 2024-02-11: qty 10

## 2024-02-11 MED ORDER — FENTANYL CITRATE PF 50 MCG/ML IJ SOSY
50.0000 ug | PREFILLED_SYRINGE | INTRAMUSCULAR | Status: DC
  Administered 2024-02-11: 50 ug via INTRAVENOUS
  Filled 2024-02-11: qty 2

## 2024-02-11 MED ORDER — AMISULPRIDE (ANTIEMETIC) 5 MG/2ML IV SOLN: 10.0000 mg | Freq: Once | INTRAVENOUS | Status: DC | PRN

## 2024-02-11 MED ORDER — ONDANSETRON HCL 4 MG/2ML IJ SOLN
INTRAMUSCULAR | Status: DC | PRN
  Administered 2024-02-11: 4 mg via INTRAVENOUS

## 2024-02-11 MED ORDER — OXYCODONE HCL 5 MG PO TABS
5.0000 mg | ORAL_TABLET | Freq: Once | ORAL | Status: AC | PRN
  Administered 2024-02-11: 5 mg via ORAL

## 2024-02-11 MED ORDER — CEFAZOLIN SODIUM-DEXTROSE 2-4 GM/100ML-% IV SOLN
2.0000 g | INTRAVENOUS | Status: AC
  Administered 2024-02-11: 2 g via INTRAVENOUS
  Filled 2024-02-11: qty 100

## 2024-02-11 MED ORDER — LACTATED RINGERS IV SOLN: INTRAVENOUS | Status: DC

## 2024-02-11 MED ORDER — ACETAMINOPHEN 325 MG PO TABS
650.0000 mg | ORAL_TABLET | Freq: Four times a day (QID) | ORAL | 0 refills | Status: AC
Start: 2024-02-11 — End: 2024-02-17

## 2024-02-11 MED ORDER — PROPOFOL 10 MG/ML IV BOLUS
INTRAVENOUS | Status: DC | PRN
  Administered 2024-02-11: 50 mg via INTRAVENOUS
  Administered 2024-02-11: 150 mg via INTRAVENOUS

## 2024-02-11 MED ORDER — ROCURONIUM BROMIDE 10 MG/ML (PF) SYRINGE
PREFILLED_SYRINGE | INTRAVENOUS | Status: DC | PRN
  Administered 2024-02-11: 50 mg via INTRAVENOUS
  Administered 2024-02-11 (×2): 20 mg via INTRAVENOUS

## 2024-02-11 SURGICAL SUPPLY — 45 items
APPLICATOR COTTON TIP 6 STRL (MISCELLANEOUS) IMPLANT
BAG COUNTER SPONGE SURGICOUNT (BAG) IMPLANT
BLADE SURG SZ11 CARB STEEL (BLADE) ×2 IMPLANT
CHLORAPREP W/TINT 26 (MISCELLANEOUS) ×2 IMPLANT
COVER MAYO STAND STRL (DRAPES) ×2 IMPLANT
COVER SURGICAL LIGHT HANDLE (MISCELLANEOUS) ×2 IMPLANT
COVER TIP SHEARS 8 DVNC (MISCELLANEOUS) ×2 IMPLANT
DERMABOND ADVANCED .7 DNX12 (GAUZE/BANDAGES/DRESSINGS) ×2 IMPLANT
DRAPE ARM DVNC X/XI (DISPOSABLE) ×6 IMPLANT
DRAPE COLUMN DVNC XI (DISPOSABLE) ×2 IMPLANT
DRIVER NDL MEGA SUTCUT DVNCXI (INSTRUMENTS) ×2 IMPLANT
DRIVER NDLE MEGA SUTCUT DVNCXI (INSTRUMENTS) ×1 IMPLANT
ELECT REM PT RETURN 15FT ADLT (MISCELLANEOUS) ×2 IMPLANT
FORCEPS BPLR 8 MD DVNC XI (FORCEP) ×2 IMPLANT
GAUZE 4X4 16PLY ~~LOC~~+RFID DBL (SPONGE) ×2 IMPLANT
GLOVE BIO SURGEON STRL SZ7 (GLOVE) ×4 IMPLANT
GOWN STRL REUS W/ TWL XL LVL3 (GOWN DISPOSABLE) ×4 IMPLANT
IRRIGATION SUCT STRKRFLW 2 WTP (MISCELLANEOUS) IMPLANT
KIT BASIN OR (CUSTOM PROCEDURE TRAY) ×2 IMPLANT
KIT TURNOVER KIT A (KITS) ×2 IMPLANT
MARKER SKIN DUAL TIP RULER LAB (MISCELLANEOUS) ×2 IMPLANT
MESH 3DMAX MID 5X7 RT XLRG (Mesh General) IMPLANT
NDL HYPO 22X1.5 SAFETY MO (MISCELLANEOUS) ×2 IMPLANT
NDL INSUFFLATION 14GA 120MM (NEEDLE) ×2 IMPLANT
NEEDLE HYPO 22X1.5 SAFETY MO (MISCELLANEOUS) ×1 IMPLANT
NEEDLE INSUFFLATION 14GA 120MM (NEEDLE) ×1 IMPLANT
OBTURATOR OPTICALSTD 8 DVNC (TROCAR) ×2 IMPLANT
PACK CARDIOVASCULAR III (CUSTOM PROCEDURE TRAY) ×2 IMPLANT
SCISSORS MNPLR CVD DVNC XI (INSTRUMENTS) ×2 IMPLANT
SEAL UNIV 5-12 XI (MISCELLANEOUS) ×6 IMPLANT
SOLUTION ANTFG W/FOAM PAD STRL (MISCELLANEOUS) ×2 IMPLANT
SOLUTION ELECTROSURG ANTI STCK (MISCELLANEOUS) ×2 IMPLANT
SPIKE FLUID TRANSFER (MISCELLANEOUS) ×2 IMPLANT
SUT MNCRL AB 4-0 PS2 18 (SUTURE) ×2 IMPLANT
SUT STRATA PDS 2-0 23 CT-1 (SUTURE) IMPLANT
SUT STRATAFIX SPIRAL PDS3-0 (SUTURE) ×2 IMPLANT
SUT VIC AB 2-0 SH 27X BRD (SUTURE) IMPLANT
SUT VIC AB 3-0 SH 27X BRD (SUTURE) ×2 IMPLANT
SYR 10ML LL (SYRINGE) ×2 IMPLANT
SYR 20ML LL LF (SYRINGE) ×2 IMPLANT
TAPE STRIPS DRAPE STRL (GAUZE/BANDAGES/DRESSINGS) ×2 IMPLANT
TOWEL GREEN STERILE FF (TOWEL DISPOSABLE) ×2 IMPLANT
TOWEL OR 17X26 10 PK STRL BLUE (TOWEL DISPOSABLE) ×2 IMPLANT
TROCAR Z-THREAD OPTICAL 5X100M (TROCAR) IMPLANT
TUBING INSUFFLATION 10FT LAP (TUBING) ×2 IMPLANT

## 2024-02-11 NOTE — Anesthesia Procedure Notes (Signed)
 Anesthesia Regional Block: TAP block   Pre-Anesthetic Checklist: , timeout performed,  Correct Patient, Correct Site, Correct Laterality,  Correct Procedure, Correct Position, site marked,  Risks and benefits discussed,  Surgical consent,  Pre-op evaluation,  At surgeon's request and post-op pain management  Laterality: Left and Right  Prep: chloraprep       Needles:  Injection technique: Single-shot  Needle Type: Echogenic Stimulator Needle     Needle Length: 9cm  Needle Gauge: 21     Additional Needles:   Procedures:,,,, ultrasound used (permanent image in chart),,    Narrative:  Start time: 02/11/2024 9:15 AM End time: 02/11/2024 9:23 AM Injection made incrementally with aspirations every 5 mL.  Performed by: Personally  Anesthesiologist: Peggye Delon Brunswick, MD  Additional Notes: Discussed risks and benefits of nerve block including, but not limited to, prolonged and/or permanent nerve injury involving sensory and/or motor function. Monitors were applied and a time-out was performed. The nerve and associated structures were visualized under ultrasound guidance. After negative aspiration, local anesthetic was slowly injected around the nerve. There was no evidence of high pressure during the procedure. There were no paresthesias. VSS remained stable and the patient tolerated the procedure well.

## 2024-02-11 NOTE — Op Note (Signed)
 Russell Castillo ([id number])  Operative Report   Date 02/11/2024  PREOPERATIVE DIAGNOSIS: Right Inguinal hernia  POSTOPERATIVE DIAGNOSIS: Same  PROCEDURE:  Robotic Right direct Inguinal Hernia Repair with Mesh (Bard 3D Midweight XL Right Polypropylene Mesh)    SURGEON: Cordella Idler, MD  ANESTHESIA: General Anesthesia    INTRAOPERATIVE FINDINGS: Small direct hernia on the right  IMPLANTS: Implant Name Type Inv. Item Serial No. Manufacturer Lot No. LRB No. Used Action  MESH 3DMAX MID 5X7 RT XLRG - [id number] Mesh General MESH 3DMAX MID 5X7 RT [employer] BARD ACCESS [id number] Right 1 Implanted    ESTIMATED BLOOD LOSS: Minimal  COMPLICATIONS: None  SPECIMENS: None  OPERATIVE INDICATIONS: Pt is a 53 y.o. male who presents with a right inguinal hernia.  The patient desires definitive repair.  The procedure's risks, benefits, and alternatives were explained to the patient.  Risks, including the risks of bleeding, infection, need for mesh removal, and potential for hernia recurrence, were discussed.  The patient agreed to proceed and signed informed consent in front of a witness.   DESCRIPTION OF PROCEDURE: After preoperatively identifying the patient in holding, the patient was brought to the operating room and placed supine on the operating room table.  Both arms were tucked and padded to avoid potential nerve injury.  Sequential Compression Devices were placed bilaterally.  After induction of general anesthesia, the patient was given the appropriate perioperative antibiotics.  The abdomen was prepped and draped in a typical sterile fashion.  A JACHO approved time out, where the name of the patient, the operation, and the intended site were confirmed. The abdomen was accessed with a Veress needle via the standard drop technique in the LUQ at Palmer's point.  Insufflation was established.  An 8 mm optical port was placed under direct visualization in the RLQ.  A camera was introduced  into the abdomen, and a thorough inspection of the abdomen was performed to confirm there was no additional pathology.  Two additional 8 mm robotic ports were placed laterally under direct visualization, one superior to the umbilicus and another in the RLQ. The patient was then placed into 15-degree Trendelenburg position to facilitate examination of the bilateral inguinal spaces.  A thorough inspection of the abdomen was undertaken.  A right inguinal hernia was identified and amenable to robotic repair.       The [employer] robot was brought into the field and docked.  An 8 mm 30-degree scope was placed in the mid-abdominal port.  The peritoneum was taken down 6 cm superior to the hernia from the anterior abdominal wall, and dissection was taken inferiorly towards the hernia.  Medial to the epigastric vessels, the parietal compartment was dissected and completed to visualize the rectus muscle.  This dissection was carried down to the symphysis pubis and obturator foramen.  The retropubic space was dissected to expose at least 2 cm contralateral to the midline.  Cooper's ligament was exposed and cleared at least 2 cm below the ligament to ensure adequate space for the inferior border of the mesh.  Hesselbach's triangle was cleared, assessing for a direct hernia. There was a small direct hernia. The direct hernia was reduced, dissecting the contents away from the border of the transversalis fascia.  Any herniated femoral contents were reduced as well.  Lateral to the epigastric vessels, the dissection was carried out into the visceral compartment, continuing in the true preperitoneal plane.  The cord structures were parietalized completely, allowing for continuous visualization of  the reflected peritoneum with the line originating 2 cm below Cooper's medially and across the psoas muscle in the lateral compartment.  The internal ring was interrogated for a cord lipoma.  Any component of cord lipoma was reduced to  the retroperitoneum and seated dorsal to the preperitoneal mesh.  Having achieved a complete dissection with a critical view of the entire myopectineal orifice, a piece of Bard 3D Midweight XL Right Polypropylene Mesh was introduced into the field.  It was centered at the iliopubic tract, with the medial side crossing the midline and the inferior edge positioned 2 cm below Cooper's ligament.  With complete coverage of the myopectineal orifice, the inferior edge of the peritoneum was posterior and inferior to the mesh.  The lateral aspect of the mesh extended 3-5 cm beyond the lateral edge of the psoas.  Cephalad retraction of the peritoneal flap did not cause lifting of the inferior mesh edge or cord structures.  The mesh was fixated using an interrupted 3-0 Vicryl suture placed to the ipsilateral Coopers ligament.  The peritoneal flap was closed with a running 3-0 Stratafix barbed suture.  Additional holes in the peritoneum closed.  Hemostasis was assured in the entirety of the abdomen.  The two lateral ports were removed under direct visualization.  The abdomen was desufflated under direct visualization.  The port sites were closed, local anesthetic was infiltrated, and Dermabond was applied.  After confirming twice that the sponge, needle, and instrument counts were correct, the procedure was terminated, the patient was extubated and transferred to the recovery room in stable condition.     DISPOSITION: Stable to PACU.   Electronically Signed By: Cordella DELENA Idler 02/11/2024 11:18 AM

## 2024-02-11 NOTE — Discharge Instructions (Signed)

## 2024-02-11 NOTE — H&P (Signed)
     Russell Castillo 02/18/1971  [id number].    HPI:  53 y/o M with a right-sided inguinal hernia who presents for elective repair. He reports that he is in his usual state of health and denies any recent changes in medication.   ROS: Review of Systems  Constitutional: Negative.   HENT: Negative.    Eyes: Negative.   Respiratory: Negative.    Cardiovascular: Negative.   Gastrointestinal: Negative.   Genitourinary: Negative.   Musculoskeletal: Negative.   Skin: Negative.   Neurological: Negative.   Endo/Heme/Allergies: Negative.   Psychiatric/Behavioral: Negative.      Family History  Problem Relation Age of Onset   Cancer Mother    Heart attack Father    Testicular cancer Brother 82       older/twin; also dx with celiac disease   Heart attack Paternal Grandfather    Celiac disease Brother        younger    Past Medical History:  Diagnosis Date   Anginal pain (HCC) 2017   Asthma    mild   GERD (gastroesophageal reflux disease)    Headache    hx of 6-10 years ago   Hypercholesteremia    Inguinal hernia    Right   Seasonal allergies     Past Surgical History:  Procedure Laterality Date   VARICOSE VEIN SURGERY  08/17/2000   WISDOM TOOTH EXTRACTION Bilateral    All 4    Social History:  reports that he has never smoked. He does not have any smokeless tobacco history on file. He reports current alcohol use. He reports that he does not use drugs.  Allergies:  Allergies  Allergen Reactions   Gluten Meal Rash    Medications Prior to Admission  Medication Sig Dispense Refill   famotidine (PEPCID) 20 MG tablet Take 20 mg by mouth every evening.     fexofenadine (ALLEGRA) 180 MG tablet Take 180 mg by mouth daily.     Multiple Vitamin (MULTIVITAMIN) tablet Take 1 tablet by mouth daily.     rosuvastatin (CRESTOR) 5 MG tablet Take 5 mg by mouth daily.      Physical Exam: Blood pressure (!) 132/91, pulse 71, temperature 98.1 F (36.7 C), temperature source  Oral, resp. rate 18, height 6' 1 (1.854 m), weight 82.6 kg, SpO2 98%. Gen: male, NAD Abd: soft, non-distended, right side marked  No results found for this or any previous visit (from the past 48 hours). No results found.  Assessment/Plan 53 y/o M w/ a right sided inguinal hernia  - Will proceed to the OR. We discussed the alternatives and potential risks of surgery, including but not limited to: bleeding, infection, damage to bowel or surrounding structures, damage to the vas/cord, mesh complications, chronic pain, recurrent hernia, and need for additional procedures. All questions were addressed and consent was obtained.    Cordella DELENA Polly Marlis Cheron Surgery 02/11/2024, 9:00 AM Please see Amion for pager number during day hours 7:00am-4:30pm or 7:00am -11:30am on weekends

## 2024-02-11 NOTE — Transfer of Care (Signed)
 Immediate Anesthesia Transfer of Care Note  Patient: Russell Castillo  Procedure(s) Performed: REPAIR, HERNIA, INGUINAL, ROBOT-ASSISTED, LAPAROSCOPIC, USING MESH (Right)  Patient Location: PACU  Anesthesia Type:General  Level of Consciousness: sedated and responds to stimulation  Airway & Oxygen Therapy: Patient Spontanous Breathing and Patient connected to nasal cannula oxygen  Post-op Assessment: Report given to RN and Post -op Vital signs reviewed and stable  Post vital signs: Reviewed and stable  Last Vitals:  Vitals Value Taken Time  BP 166/109 02/11/24 11:35  Temp 35.6 C 02/11/24 11:34  Pulse 63 02/11/24 11:40  Resp 14 02/11/24 11:40  SpO2 100 % 02/11/24 11:40  Vitals shown include unfiled device data.  Last Pain:  Vitals:   02/11/24 0935  TempSrc:   PainSc: 0-No pain         Complications: No notable events documented.

## 2024-02-11 NOTE — Anesthesia Postprocedure Evaluation (Signed)
 Anesthesia Post Note  Patient: Russell Castillo  Procedure(s) Performed: REPAIR, HERNIA, INGUINAL, ROBOT-ASSISTED, LAPAROSCOPIC, USING MESH (Right)     Patient location during evaluation: PACU Anesthesia Type: General Level of consciousness: awake Pain management: pain level controlled Vital Signs Assessment: post-procedure vital signs reviewed and stable Respiratory status: spontaneous breathing, nonlabored ventilation and respiratory function stable Cardiovascular status: blood pressure returned to baseline and stable Postop Assessment: no apparent nausea or vomiting Anesthetic complications: no   No notable events documented.  Last Vitals:  Vitals:   02/11/24 1245 02/11/24 1300  BP: 133/86 134/85  Pulse: 84 88  Resp: 12 13  Temp:  (!) 36.3 C  SpO2: 96% 98%    Last Pain:  Vitals:   02/11/24 1300  TempSrc:   PainSc: 4                  Delon Aisha Arch

## 2024-02-11 NOTE — Anesthesia Procedure Notes (Signed)
 Procedure Name: Intubation Date/Time: 02/11/2024 9:53 AM  Performed by: Obadiah Reyes BROCKS, CRNAPre-anesthesia Checklist: Patient identified, Emergency Drugs available, Suction available and Patient being monitored Patient Re-evaluated:Patient Re-evaluated prior to induction Oxygen Delivery Method: Circle System Utilized Preoxygenation: Pre-oxygenation with 100% oxygen Induction Type: IV induction Ventilation: Mask ventilation without difficulty Laryngoscope Size: Miller and 2 Grade View: Grade I Tube type: Oral Tube size: 7.0 mm Number of attempts: 1 Airway Equipment and Method: Stylet and Oral airway Placement Confirmation: ETT inserted through vocal cords under direct vision, positive ETCO2 and breath sounds checked- equal and bilateral Secured at: 22 cm Tube secured with: Tape Dental Injury: Teeth and Oropharynx as per pre-operative assessment

## 2024-02-14 ENCOUNTER — Encounter (HOSPITAL_COMMUNITY): Payer: Self-pay | Admitting: General Surgery

## 2024-03-02 NOTE — Progress Notes (Signed)
   PROVIDER:  CORDELLA BEVERLEY IDLER, MD  MRN: [id number] DOB: [birth year] DATE OF ENCOUNTER: 03/02/2024 Interval History:     Reports that he had some swelling that has since resolved. Continues to have some pain with movements and developed a little bruising last night    Physical Examination:   Physical Exam   Gen: male, NAD Abd: port sites well healed, slight suprapubic ecchymosis  Groin: no swelling  Assessment and Plan:     Russell Castillo is a 53 y.o. male who underwent a robotic right inguinal hernia repair with mesh. He reports that he experienced a little setback in his recovery but overall he is healing as expected. We discussed the expected recovery from surgery. He will gradually increase his activity level back to baseline.   The plan was discussed in detail with the patient today, who expressed understanding.  The patient has my contact information, and understands to call me with any additional questions or concerns in the interval.  I would be happy to see the patient back sooner if the need arises.   GREGORY BEVERLEY IDLER, MD
